# Patient Record
Sex: Male | Born: 1960 | Race: Black or African American | Hispanic: No | Marital: Married
Health system: Southern US, Community
[De-identification: ages and names within clinical notes are randomized; demographics above are authoritative.]

---

## 2004-07-14 ENCOUNTER — Other Ambulatory Visit: Payer: Self-pay

## 2007-04-13 ENCOUNTER — Emergency Department: Payer: Self-pay | Admitting: Emergency Medicine

## 2009-11-14 ENCOUNTER — Inpatient Hospital Stay: Payer: Self-pay | Admitting: Internal Medicine

## 2012-11-10 ENCOUNTER — Emergency Department: Payer: Self-pay | Admitting: Emergency Medicine

## 2012-11-10 LAB — CBC WITH DIFFERENTIAL/PLATELET
Eosinophil #: 0.1 10*3/uL (ref 0.0–0.7)
Eosinophil %: 1 %
HCT: 36.6 % — ABNORMAL LOW (ref 40.0–52.0)
Lymphocyte %: 21.7 %
MCH: 33.1 pg (ref 26.0–34.0)
MCHC: 33.7 g/dL (ref 32.0–36.0)
Monocyte #: 0.2 x10 3/mm (ref 0.2–1.0)
Neutrophil %: 72.8 %
Platelet: 237 10*3/uL (ref 150–440)
RBC: 3.73 10*6/uL — ABNORMAL LOW (ref 4.40–5.90)
RDW: 12.9 % (ref 11.5–14.5)

## 2012-11-10 LAB — COMPREHENSIVE METABOLIC PANEL
BUN: 6 mg/dL — ABNORMAL LOW (ref 7–18)
Bilirubin,Total: 0.2 mg/dL (ref 0.2–1.0)
Calcium, Total: 8.4 mg/dL — ABNORMAL LOW (ref 8.5–10.1)
Creatinine: 0.92 mg/dL (ref 0.60–1.30)
EGFR (African American): >60
Glucose: 94 mg/dL (ref 65–99)
Osmolality: 267 (ref 275–301)
Potassium: 3.7 mmol/L (ref 3.5–5.1)
Sodium: 135 mmol/L — ABNORMAL LOW (ref 136–145)
Total Protein: 7.9 g/dL (ref 6.4–8.2)

## 2012-11-10 LAB — ETHANOL: Ethanol %: 0.281 % — ABNORMAL HIGH (ref 0.000–0.080)

## 2012-12-12 ENCOUNTER — Emergency Department: Payer: Self-pay | Admitting: Emergency Medicine

## 2013-01-11 ENCOUNTER — Emergency Department: Payer: Self-pay | Admitting: Emergency Medicine

## 2013-10-13 IMAGING — CT CT OF THE LEFT ANKLE WITHOUT CONTRAST
1 series · 12 of 14 positions shown, 15 images · non-contrast
Comparison: none

REASON FOR EXAM: at request of ortho to evaul tibial plafond
COMMENTS:

PROCEDURE:     CT  - CT ANKLE LEFT WITHOUT CONTRAST  - November 10, 2012  [DATE]
RESULT:     Left ankle CT dated 11/10/2012.
TECHNIQUE: Multiplanar imaging of the left angles obtained utilizing helical
1 mm acquisition and bone reconstruction algorithm.

[Series 2: axial · axial · 0.38mm/px · z∈[-624,-434]mm · 12 of 225 slices shown, 15 images]
[im 18/225  soft-tissue]
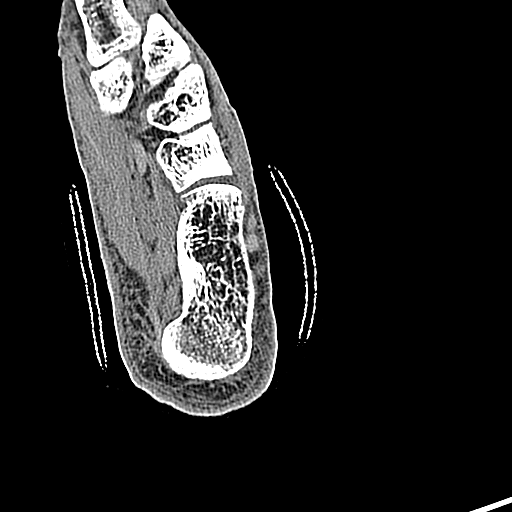
[im 18/225  bone]
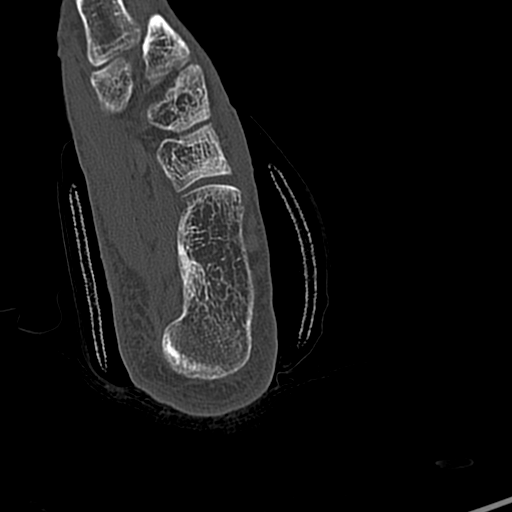
[im 35/225  bone]
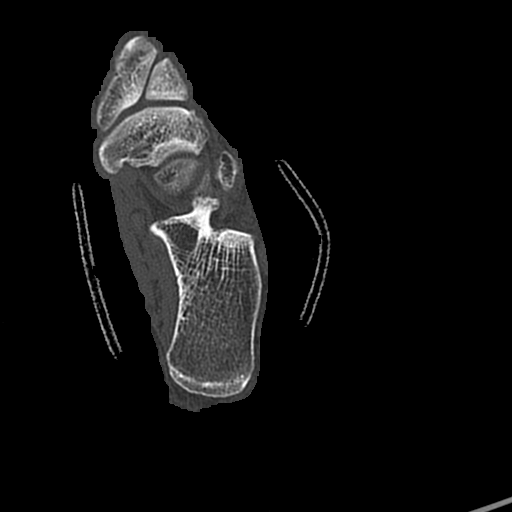
[im 52/225  bone]
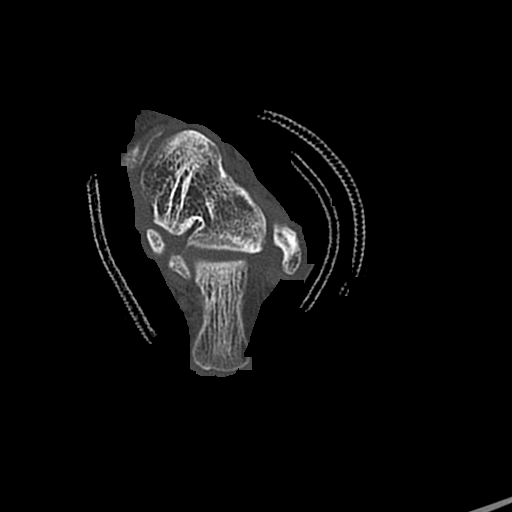
[im 69/225  bone]
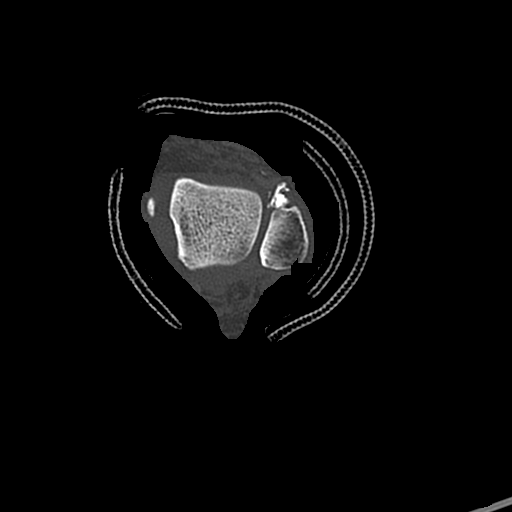
[im 87/225  soft-tissue]
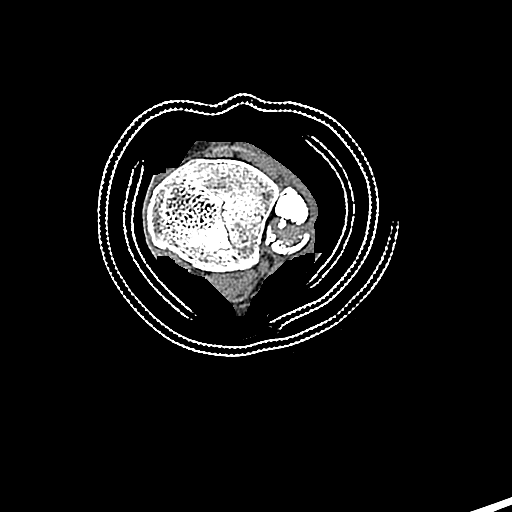
[im 87/225  bone]
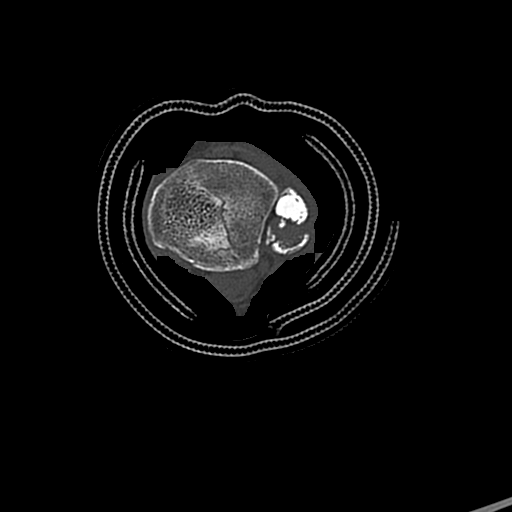
[im 104/225  bone]
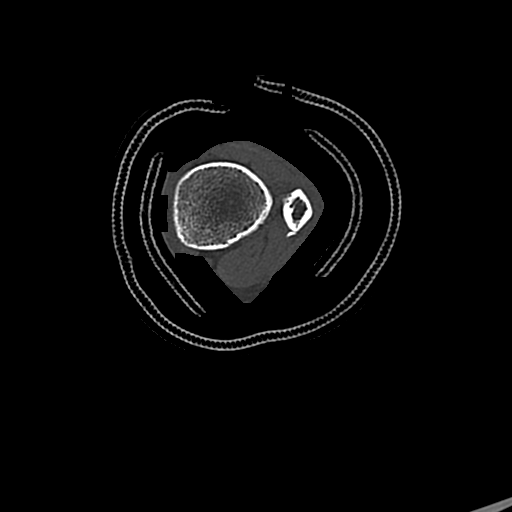
[im 121/225  bone]
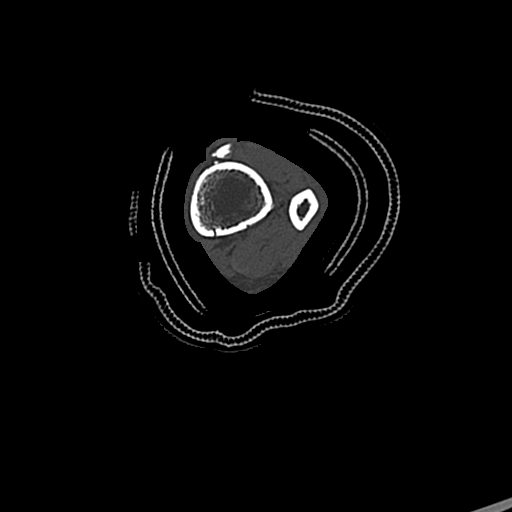
[im 138/225  bone]
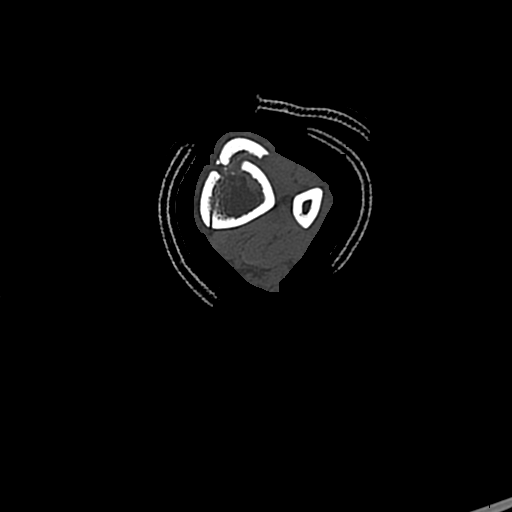
[im 156/225  soft-tissue]
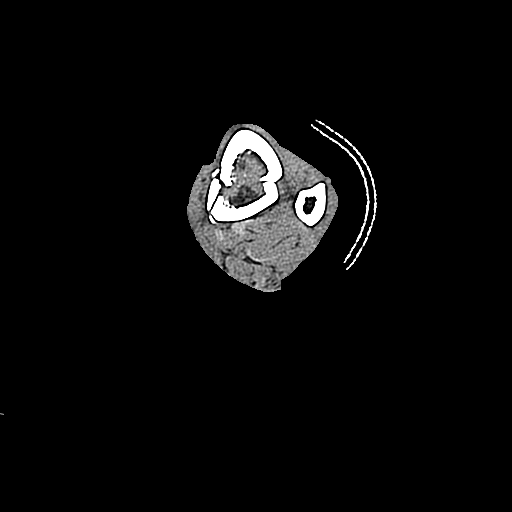
[im 156/225  bone]
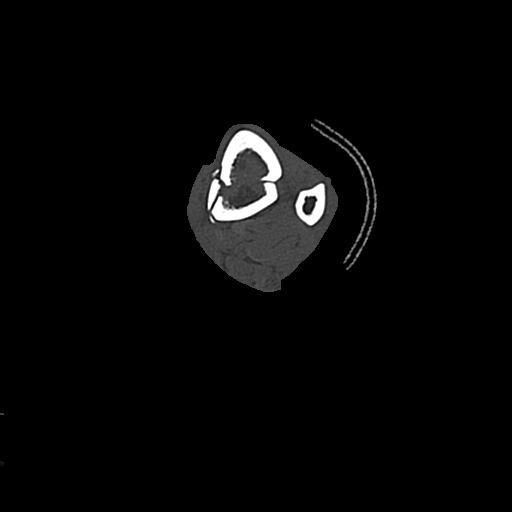
[im 173/225  bone]
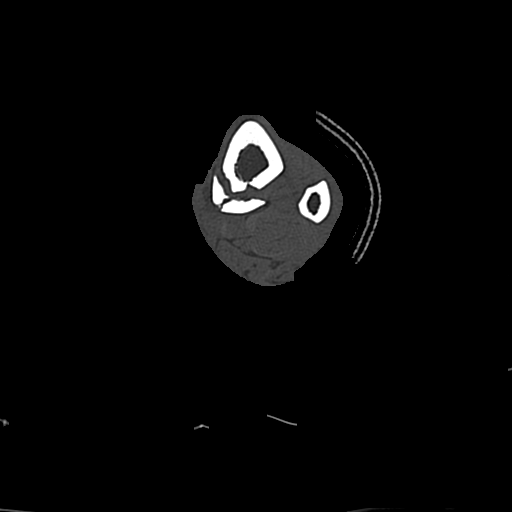
[im 190/225  bone]
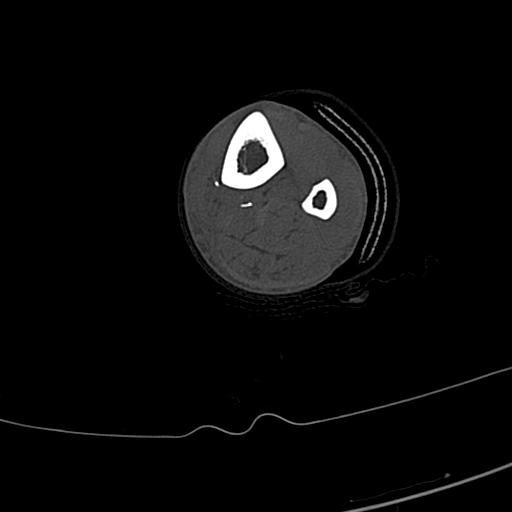
[im 207/225  bone]
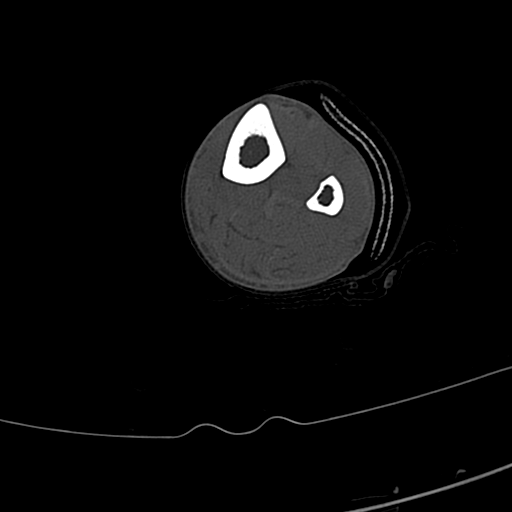

[12 of 14 positions shown; findings below may reference images not displayed]

FINDINGS: An oblique fracture is identified within the distal tibial shaft
demonstrating anterolateral angulation and is placement an approximately one
to 1.5 cm of override. The fracture is oblique and fracture lines extend
into the distal tibia. Fracture lines extend through the tibial plafond,
though the morphology of the plafond remains intact. A minor step off is
identified on the coronal image centrally within the plafond image #49 to
57. The step-off appears to appears to be less than 1 mm. The ankle mortise
is otherwise intact. An oblique fracture is appreciated extending through
the distal tibia with intra-articular extension.
IMPRESSION: Distal tibia and fibular fractures with extension into the
ankle as described above
2. Evaluation of the tibial plafond demonstrates areas which may represent
submillimeter step-off though the morphology of the plafond is otherwise
intact.

## 2013-11-16 LAB — CBC
HCT: 42.7 % (ref 40.0–52.0)
HGB: 14.4 g/dL (ref 13.0–18.0)
MCH: 33 pg (ref 26.0–34.0)
MCHC: 33.7 g/dL (ref 32.0–36.0)
MCV: 98 fL (ref 80–100)
Platelet: 274 10*3/uL (ref 150–440)
RBC: 4.36 10*6/uL — ABNORMAL LOW (ref 4.40–5.90)
RDW: 12.6 % (ref 11.5–14.5)
WBC: 4.8 10*3/uL (ref 3.8–10.6)

## 2013-11-16 LAB — DRUG SCREEN, URINE

## 2013-11-16 LAB — COMPREHENSIVE METABOLIC PANEL
ALK PHOS: 94 U/L
ALT: 25 U/L (ref 12–78)
Albumin: 4.3 g/dL (ref 3.4–5.0)
Anion Gap: 6 — ABNORMAL LOW (ref 7–16)
BUN: 7 mg/dL (ref 7–18)
Bilirubin,Total: 0.5 mg/dL (ref 0.2–1.0)
CALCIUM: 9.2 mg/dL (ref 8.5–10.1)
CREATININE: 0.77 mg/dL (ref 0.60–1.30)
Chloride: 100 mmol/L (ref 98–107)
Co2: 27 mmol/L (ref 21–32)
EGFR (African American): >60
EGFR (Non-African Amer.): >60
GLUCOSE: 74 mg/dL (ref 65–99)
OSMOLALITY: 263 (ref 275–301)
Potassium: 3.6 mmol/L (ref 3.5–5.1)
SGOT(AST): 40 U/L — ABNORMAL HIGH (ref 15–37)
Sodium: 133 mmol/L — ABNORMAL LOW (ref 136–145)
Total Protein: 8.7 g/dL — ABNORMAL HIGH (ref 6.4–8.2)

## 2013-11-16 LAB — SALICYLATE LEVEL: Salicylates, Serum: 3.5 mg/dL — ABNORMAL HIGH

## 2013-11-16 LAB — ACETAMINOPHEN LEVEL: Acetaminophen: <2 ug/mL

## 2013-11-16 LAB — TSH: THYROID STIMULATING HORM: 3.29 u[IU]/mL

## 2013-11-16 LAB — ETHANOL
ETHANOL %: 0.258 % — AB (ref 0.000–0.080)
Ethanol: 258 mg/dL

## 2013-11-17 ENCOUNTER — Inpatient Hospital Stay: Payer: Self-pay | Admitting: Psychiatry

## 2014-11-02 ENCOUNTER — Emergency Department: Payer: Self-pay | Admitting: Emergency Medicine

## 2014-11-02 LAB — URINALYSIS, COMPLETE
Bacteria: NONE SEEN
Bilirubin,UR: NEGATIVE
Blood: NEGATIVE
Glucose,UR: NEGATIVE mg/dL (ref 0–75)
KETONE: NEGATIVE
Leukocyte Esterase: NEGATIVE
Nitrite: NEGATIVE
PH: 5 (ref 4.5–8.0)
PROTEIN: NEGATIVE
RBC,UR: NONE SEEN /HPF (ref 0–5)
Specific Gravity: 1.002 (ref 1.003–1.030)
WBC UR: NONE SEEN /HPF (ref 0–5)

## 2014-11-02 LAB — CBC
HCT: 41.4 % (ref 40.0–52.0)
HGB: 13.6 g/dL (ref 13.0–18.0)
MCH: 33.2 pg (ref 26.0–34.0)
MCHC: 32.9 g/dL (ref 32.0–36.0)
MCV: 101 fL — AB (ref 80–100)
PLATELETS: 199 10*3/uL (ref 150–440)
RBC: 4.11 10*6/uL — AB (ref 4.40–5.90)
RDW: 12.8 % (ref 11.5–14.5)
WBC: 5.1 10*3/uL (ref 3.8–10.6)

## 2014-11-02 LAB — ETHANOL: Ethanol: 277 mg/dL

## 2014-11-02 LAB — COMPREHENSIVE METABOLIC PANEL
ALBUMIN: 3.9 g/dL (ref 3.4–5.0)
ALK PHOS: 103 U/L
Anion Gap: 9 (ref 7–16)
BILIRUBIN TOTAL: 0.6 mg/dL (ref 0.2–1.0)
BUN: 8 mg/dL (ref 7–18)
CO2: 25 mmol/L (ref 21–32)
CREATININE: 0.75 mg/dL (ref 0.60–1.30)
Calcium, Total: 8.8 mg/dL (ref 8.5–10.1)
Chloride: 99 mmol/L (ref 98–107)
EGFR (African American): >60
Glucose: 78 mg/dL (ref 65–99)
Osmolality: 264 (ref 275–301)
Potassium: 4 mmol/L (ref 3.5–5.1)
SGOT(AST): 76 U/L — ABNORMAL HIGH (ref 15–37)
SGPT (ALT): 47 U/L
Sodium: 133 mmol/L — ABNORMAL LOW (ref 136–145)
Total Protein: 8.3 g/dL — ABNORMAL HIGH (ref 6.4–8.2)

## 2014-11-02 LAB — DRUG SCREEN, URINE
Amphetamines, Ur Screen: NEGATIVE (ref ?–1000)
BENZODIAZEPINE, UR SCRN: NEGATIVE (ref ?–200)
Barbiturates, Ur Screen: NEGATIVE (ref ?–200)
CANNABINOID 50 NG, UR ~~LOC~~: POSITIVE (ref ?–50)
COCAINE METABOLITE, UR ~~LOC~~: NEGATIVE (ref ?–300)
MDMA (Ecstasy)Ur Screen: NEGATIVE (ref ?–500)
Methadone, Ur Screen: NEGATIVE (ref ?–300)
Opiate, Ur Screen: NEGATIVE (ref ?–300)
Phencyclidine (PCP) Ur S: NEGATIVE (ref ?–25)
Tricyclic, Ur Screen: NEGATIVE (ref ?–1000)

## 2014-11-02 LAB — ACETAMINOPHEN LEVEL

## 2014-11-02 LAB — SALICYLATE LEVEL: Salicylates, Serum: 4.1 mg/dL — ABNORMAL HIGH

## 2015-01-10 ENCOUNTER — Emergency Department: Payer: Self-pay | Admitting: Emergency Medicine

## 2015-02-23 NOTE — H&P (Signed)
PATIENT NAME:  Kristopher Rodriguez, Elyan E MR#:  [id number] DATE OF BIRTH:  [birth year]  DATE OF ADMISSION:  11/16/2013.  DATE OF ASSESSMENT:  11/17/2013.  CONSULTING PHYSICIAN:  Audery AmelJohn T. Czarina Gingras, M.D.   IDENTIFYING INFORMATION AND CHIEF COMPLAINT:  A 54 year old man came to the Emergency Room intoxicated, requesting detox.   CHIEF COMPLAINT:  "I just been real depressed."   HISTORY OF PRESENT ILLNESS:  Information obtained from the patient and the chart. The patient reports that his mood has been increasingly depressed. He has been depressed for a few years but it has been getting worse recently. He feels stressed by the fact that he has no place to live, and he has been continuing to drink alcohol heavily on a daily basis. He had some thoughts about walking out in front of traffic and killing himself. He seems to have little other social support around. Minimal social contact. Does not present as being psychotic. Last used any other drugs about a week or so ago when he smoked some marijuana. He is voluntarily requesting detox.   PAST PSYCHIATRIC HISTORY:  Denies any psychiatric hospitalizations other than one detox that he has had in the past. Denies any history of suicide attempts. Denies any other past psychiatric history. He says that he has stopped drinking for several months when he had to because he was locked up, but otherwise he has not made many serious attempts to try to stop drinking.   PAST MEDICAL HISTORY:  The patient had a pretty bad fracture of his left leg just about a year ago. He still seems to have some pain in it; otherwise denies any significant ongoing medical problems.   SOCIAL HISTORY:  The patient does have multiple family members in the area but seems to not be in very close contact with most of them. He says he has been mostly staying in cars or wherever he can find a place to stay. 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   SUBSTANCE ABUSE HISTORY:  Ongoing alcohol abuse with current blood alcohol level over 200. Uses occasional marijuana. Denies other active drug use. It sounds like he has only been briefly involved in inpatient detox before without a lot of other substance abuse treatment history.   MEDICATIONS:  None.   ALLERGIES:  No known drug allergies.   REVIEW OF SYSTEMS:  Feeling depressed, having suicidal ideation. Denies hallucinations or delusions.   MENTAL STATUS EXAMINATION:  A thin gentleman but who otherwise appears in no acute distress. Cooperative with the interview. Good eye contact. Psychomotor activity normal. Speech normal overall rate, tone, and volume. Affect slightly blunted but not tearful or distraught. Mood stated as being depressed. Thoughts are lucid without loosening of associations or delusions. Denies auditory or visual hallucinations. Denies any homicidal ideation. Does report suicidal ideation with only vague plans. Alert and oriented. Appears to have average intelligence. Judgment and insight adequate.   PHYSICAL EXAMINATION:  GENERAL:  A thin gentleman in no acute distress. He has a clear area on his left lower shin where he had a fracture repaired, but the skin is healed up now. No other skin lesions.  HEENT:  Pupils equal and reactive. Face symmetric.  MUSCULOSKELETAL:  Full range of motion at extremities. Strength and reflexes grossly intact.  NEUROLOGIC:  Cranial nerves symmetric and  intact.  LUNGS:  Clear without wheezes.  HEART:  Regular rate and rhythm.  ABDOMEN:  Soft, nontender, normal bowel sounds.   CURRENT VITAL SIGNS:  Showed a temperature of 97.5, pulse 80, respirations 18, blood pressure 127/65.   LABORATORY RESULTS:  Chemistry panel shows a low sodium at 133, elevated AST at 40, elevated total protein 8.7. Alcohol level 258. Salicylates slightly elevated  at 3.5. TSH normal at 3.29. The rest of the drug screen negative.   ASSESSMENT:  A 54 year old man with alcohol abuse and depression, seeking detox and stabilization, claiming suicidal ideation.   TREATMENT PLAN:  Admit to Psychiatry. Detox panel in place. Monitor vital signs. Engage him in groups and individual therapy. Once he is downstairs and sobered up a little bit more, we will reassess depression and see if he needs any more specific treatment. Consider referral to a longer term treatment necessary.   DIAGNOSIS, PRINCIPAL AND PRIMARY:  AXIS I: Alcohol dependence.   SECONDARY DIAGNOSES:  AXIS I: Depression, not otherwise specified.  AXIS II: Deferred.  AXIS III: Status post fracture of leg with some chronic discomfort.  AXIS IV: Severe from homelessness.  AXIS V: Functioning at time of evaluation is 34.   ____________________________ Audery Amel, MD jtc:jm D: 11/17/2013 17:16:07 ET T: 11/17/2013 17:41:28 ET JOB#: [id number]  cc: Audery Amel, MD, <Dictator> Audery Amel MD ELECTRONICALLY SIGNED 11/17/2013 20:14

## 2015-02-23 NOTE — Consult Note (Signed)
PATIENT NAME:  Kristopher Rodriguez, Kristopher Rodriguez MR#:  [id number] DATE OF BIRTH:  Oct 12, 1961  DATE OF CONSULTATION:  11/23/2013  CONSULTING PHYSICIAN:  Murlean HarkShalini Draiden Mirsky, MD  CHIEF COMPLAINT: Left ankle pain.   HISTORY OF PRESENT ILLNESS: Kristopher Rodriguez is a 54 year old gentleman who sustained a tibial pilon and distal fibular fracture approximately one year ago. [facility]. The patient had an open reduction and internal fixation of the tibial fracture and of the fibular fracture. He states that he has had chronic pain in the ankle since that time. He is currently weight-bearing as tolerated.   PHYSICAL EXAMINATION: The patient ambulates with a relatively normal gait. He had dorsiflexion strength. Some decrease in plantar flexion strength. Limitation in inversion and eversion motion. Medial and lateral plates are both quite prominent. Two screw heads on the lateral side are quite prominent. The patient has significant tenderness to palpation along the prominent hardware. Skin appears normal with no lesions, erythema, or signs of infection. Altered sensation, slight decreased on dorsum of the foot at both SPN and DPN distributions. The patient describes tingling, but intact sensation to light touch plantar surface of the foot. DP is 1+ palpable.   IMAGING STUDIES: AP, lateral, and mortise radiographs of the ankle were ordered. They demonstrate hardware in preserved position with good healing of tibia fracture. Of note, the proximal aspect of the tibial plate is not included on this view.   ASSESSMENT: One status post open reduction internal fixation of tibial pilon fracture and distal fibular fracture. Ongoing pain, possibly secondary to symptomatic hardware.   PLAN: The patient does not recall the name of his surgeon; however, his sister is bringing his medical record to the hospital today. He will need to arrange follow-up appointment with his operating surgeon. They can  then discuss appropriate intervention and consideration of hardware removal if they decide that that is what will be helpful. The patient may continue to be weight-bearing as tolerated in the interim. No orthopedic intervention is necessary at this time.   ____________________________ Murlean HarkShalini Presleigh Feldstein, MD sr:sg D: 11/23/2013 09:50:00 ET T: 11/23/2013 10:08:39 ET JOB#: [id number]  cc: Murlean HarkShalini Ewa Hipp, MD, <Dictator> Murlean HarkSHALINI Mamye Bolds MD ELECTRONICALLY SIGNED 01/04/2014 8:47

## 2015-02-23 NOTE — Consult Note (Signed)
Brief Consult Note: Diagnosis: Left tibial pilon fracture one year s/p ORIF by [facility].   Patient was seen by consultant.   Consult note dictated.   Comments: Continue WBAT.   Likely symptomatic hardware.  Good healing of fracture shown on radiograph. Recommend follow up as outpatient with operating surgeon.  They can discuss possibility of hardware removal.  Electronic Signatures: Murlean Harkamasunder, Alvena Kiernan (MD)  (Signed 22-Jan-15 09:46)  Authored: Brief Consult Note   Last Updated: 22-Jan-15 09:46 by Murlean Harkamasunder, Leam Madero (MD)

## 2015-02-23 NOTE — Discharge Summary (Signed)
PATIENT NAME:  Kristopher Rodriguez, Kristopher Rodriguez MR#:  [id number] DATE OF BIRTH:  1961/09/12  DATE OF ADMISSION:  11/17/2013 DATE OF DISCHARGE:  11/28/2013  HOSPITAL COURSE: See dictated history and physical for details of admission. A 54 year old man with a history of substance abuse, primarily alcohol, also cocaine, came into the hospital, depressed, agitated, intoxicated requiring detox. Initially, he had a very depressed mood and was very withdrawn. He was treated with monitored medical detox. He did not have a seizure or become delirious, but he was sedated and dysphoric for several days. Once he completed detox, his mood improved. He now reports no symptoms of depression. He is not feeling sad and down, and he feels more optimistic and hopeful about his situation. The patient very convincingly reported that he had been homeless for some time and had no real support in the area. He very much wanted to go to an inpatient rehab program. Social work did his initial evaluation, suggested the [facility] program as a good option for him. The patient became fixed on this, and we have pursued it. He has been accepted now for admission to the [facility] program. He is currently in good health, in good spirits, not taking any current medications, not having any signs of major depression. He has been engaged in group and individual therapy throughout his hospital stay and shows improved insight and motivation. The patient was evaluated by orthopedic surgery because of chronic pain in his ankle. His left ankle suffered a fracture and had hardware implanted in it. Orthopedic surgery agreed that he may be a good candidate for removal of the hardware in the future and that he should be followed up as an outpatient, but there was no acute emergency.   DISCHARGE MEDICATIONS: None.   LABORATORY RESULTS: Labs on admission showed a drug screen that was negative. TSH normal at 3.29. Alcohol level 258. Sodium slightly low at 133. AST elevated at 40,  total protein elevated at 8.7. CBC showed a slightly low red count, otherwise all entirely normal. Salicylates 3.5, slightly above normal, but still in the therapeutic range.   An x-ray was done of his left ankle that showed old postsurgical changes with hardware implanted.   DISPOSITION: He will be discharged today. We understand he is to be picked up by the [facility] program and will be transferred there for further rehab therapy.    DIAGNOSIS, PRINCIPAL AND PRIMARY:  AXIS I: Alcohol dependence.   SECONDARY DIAGNOSES: AXIS I: Cocaine abuse.  AXIS II: Deferred.  AXIS III: Chronic pain, status post fracture of the ankle.  AXIS IV: Severe from homelessness and lack of support.    AXIS V: Functioning at time of discharge is 54.    ____________________________ Audery AmelJohn T. Clapacs, MD jtc:dmm D: 11/28/2013 09:54:32 ET T: 11/28/2013 10:16:35 ET JOB#: [id number]  cc: Audery AmelJohn T. Clapacs, MD, <Dictator> Audery AmelJOHN T CLAPACS MD ELECTRONICALLY SIGNED 11/29/2013 15:21

## 2015-12-13 IMAGING — CR RIGHT ANKLE - COMPLETE 3+ VIEW
1 series · 3 of 3 positions shown · non-contrast
Comparison: None

CLINICAL DATA: RIGHT ankle pain for 3 weeks, lateral swelling, no
injury

EXAM:
RIGHT ANKLE - COMPLETE 3+ VIEW

[Series 1: ap · 0.17mm/px · 3 of 3 slices shown]
[im 1/3]
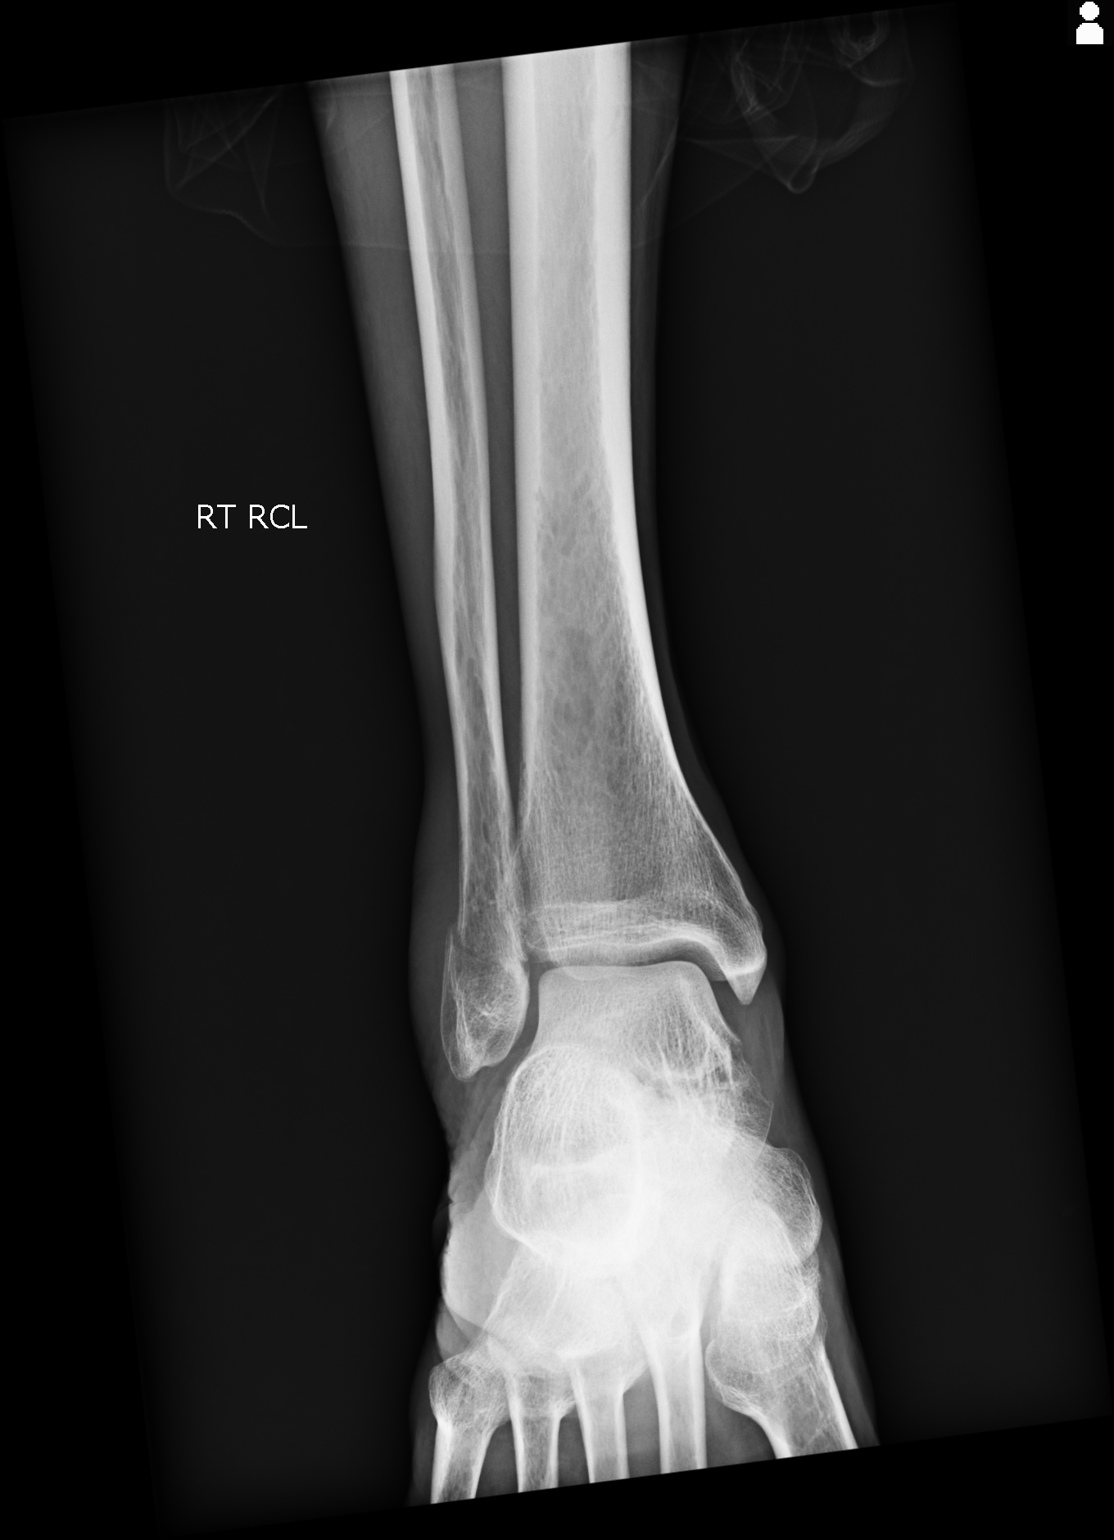
[im 2/3]
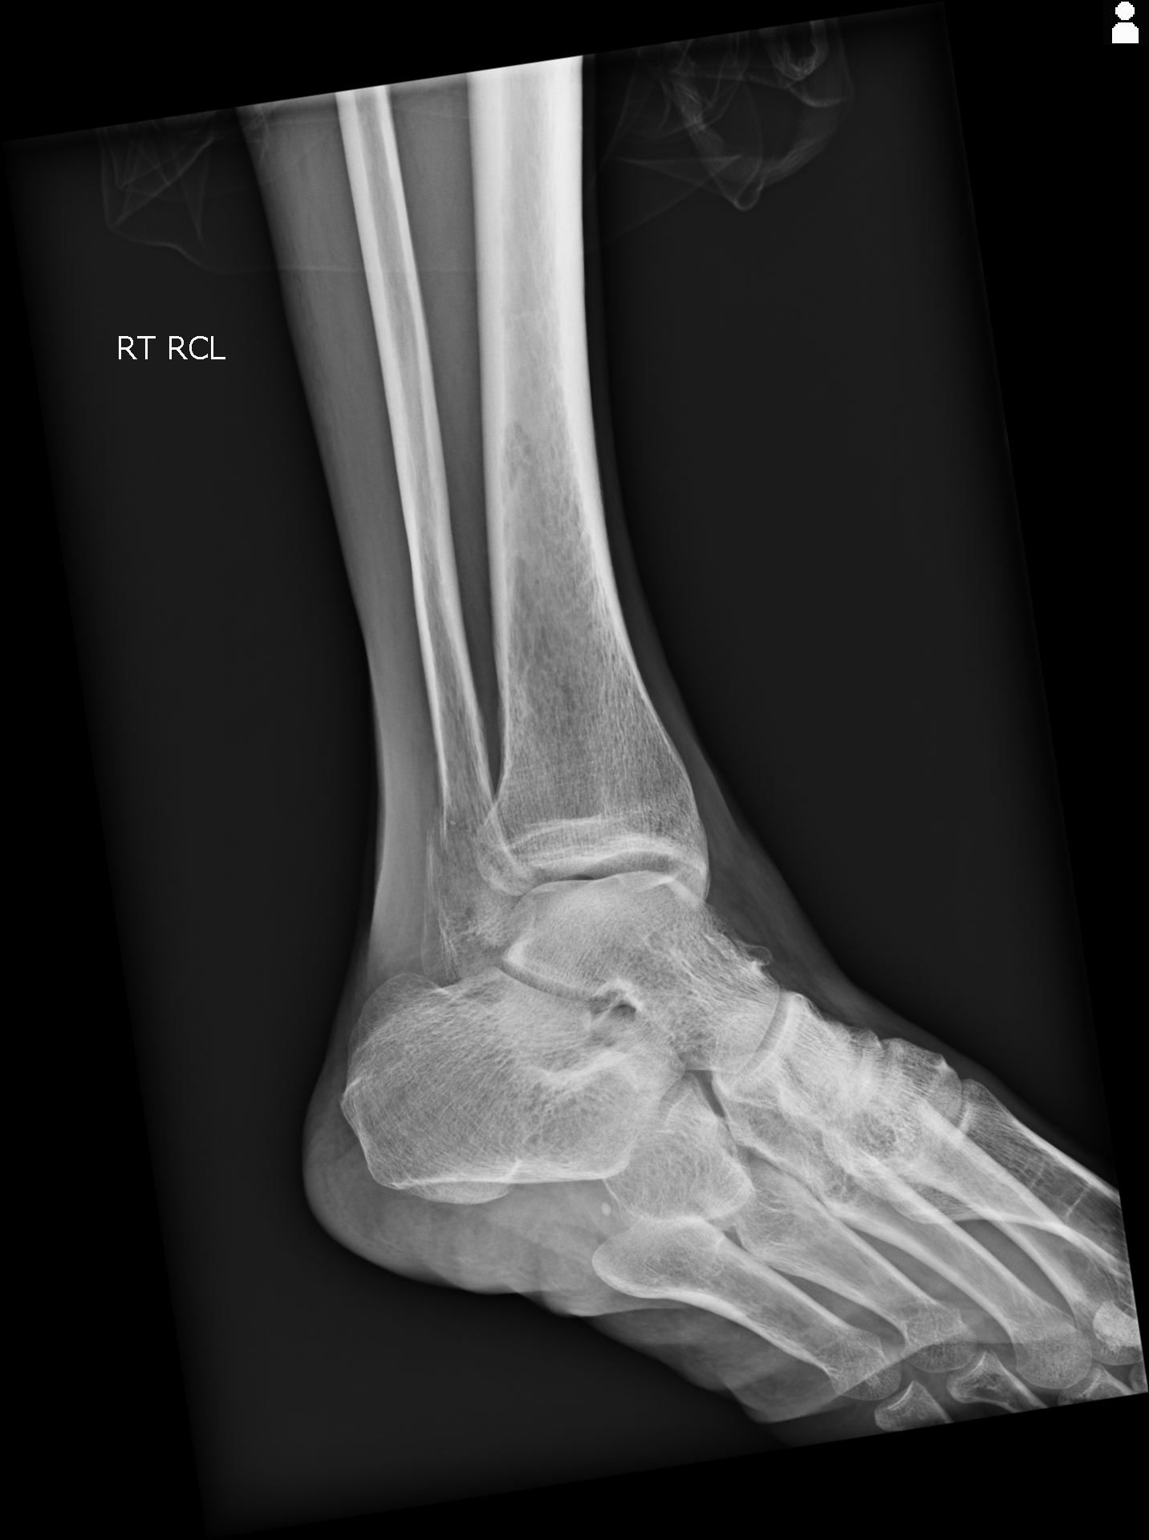
[im 3/3]
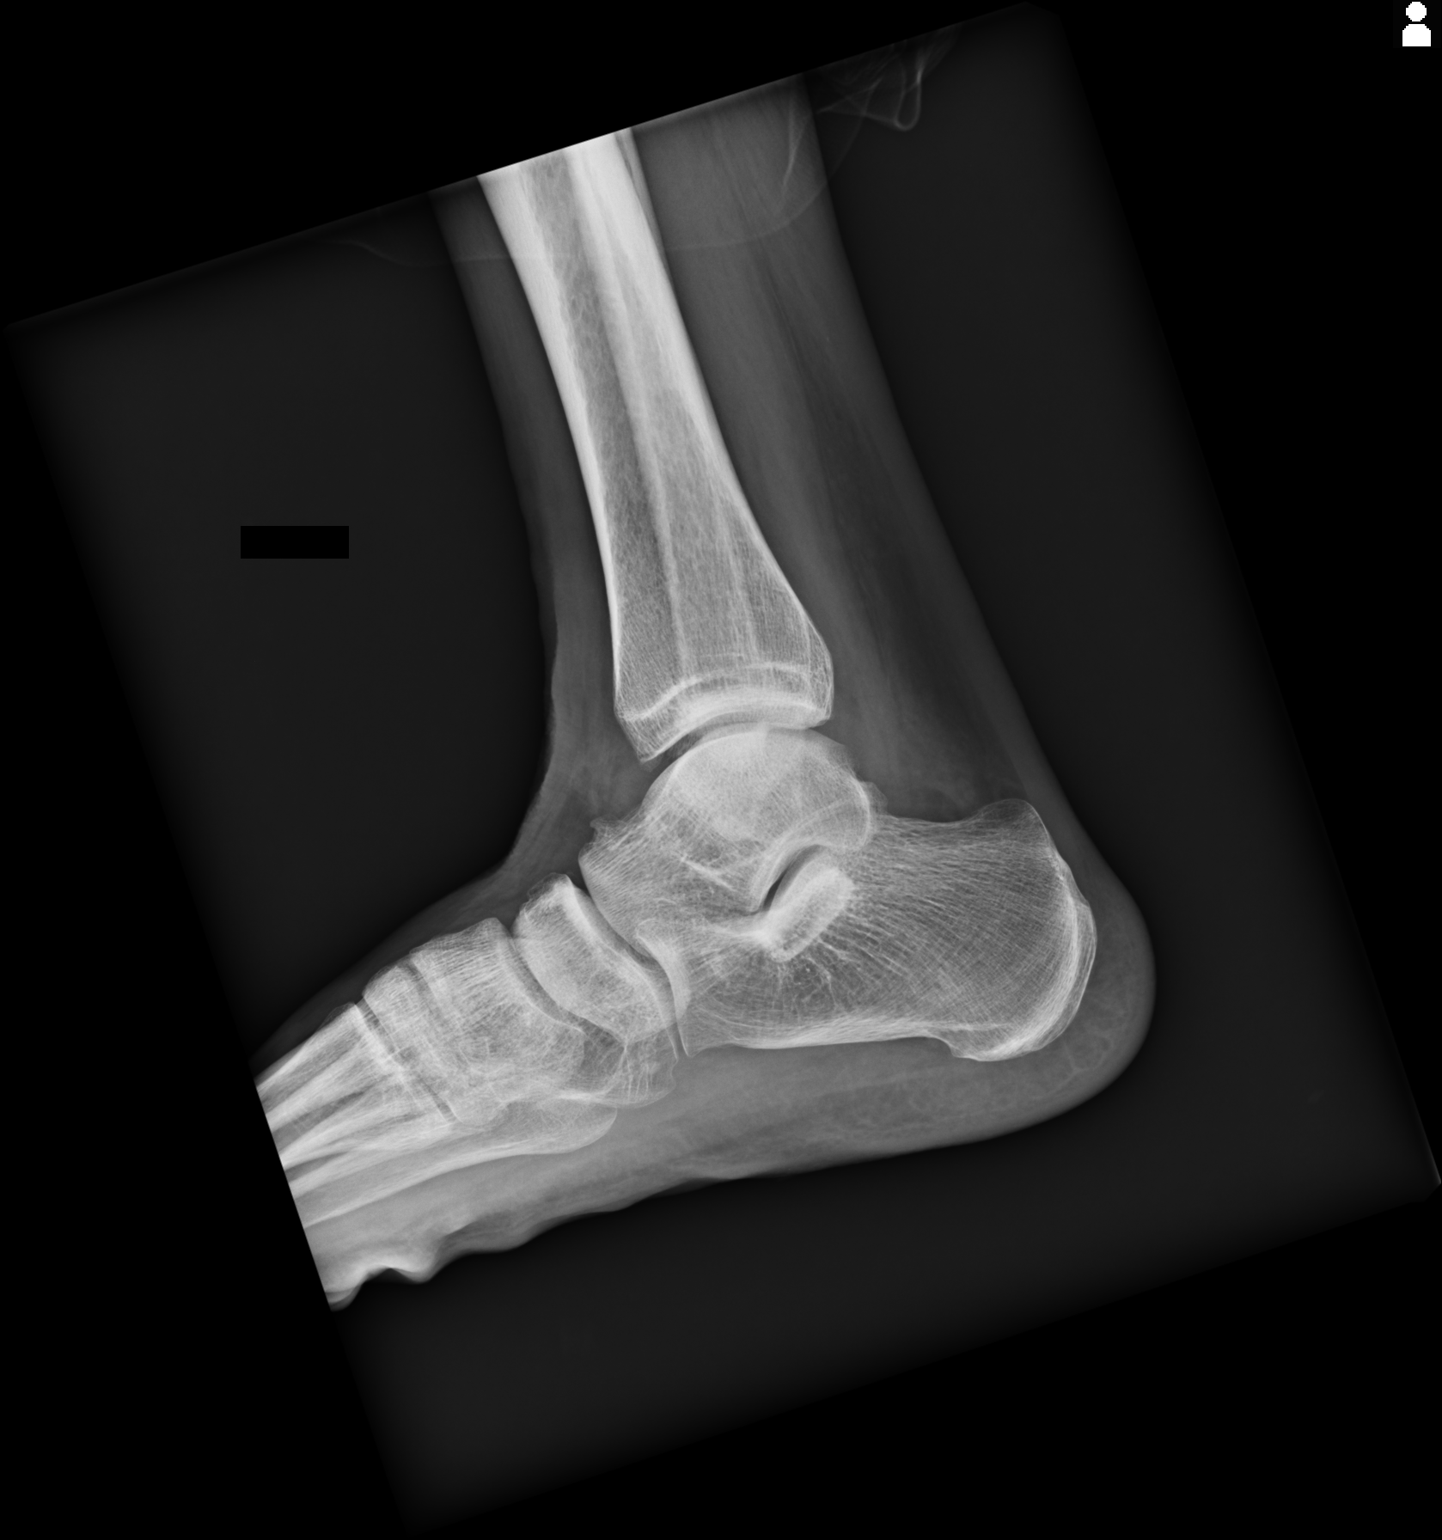

[3 of 3 positions shown; findings below may reference images not displayed]

FINDINGS: Osseous demineralization.

Ankle mortise intact.

Soft tissue swelling greatest laterally.

Oblique fracture of the lateral malleolus, minimally displaced.

No additional fracture, dislocation, or bone destruction.
IMPRESSION: Minimally displaced oblique lateral malleolar fracture.

## 2017-11-02 DEATH — deceased
